# Patient Record
Sex: Male | Born: 1990 | Race: White | Hispanic: No | Marital: Married | Smoking: Former smoker
Health system: Southern US, Community
[De-identification: ages and names within clinical notes are randomized; demographics above are authoritative.]

## PROBLEM LIST (undated history)

## (undated) HISTORY — PX: LASIK: SHX215

## (undated) HISTORY — PX: WISDOM TOOTH EXTRACTION: SHX21

## (undated) HISTORY — PX: EYE SURGERY: SHX253

---

## 2017-12-01 DIAGNOSIS — Z23 Encounter for immunization: Secondary | ICD-10-CM | POA: Diagnosis not present

## 2017-12-01 DIAGNOSIS — Z0000 Encounter for general adult medical examination without abnormal findings: Secondary | ICD-10-CM | POA: Diagnosis not present

## 2020-09-23 ENCOUNTER — Emergency Department (HOSPITAL_COMMUNITY)
Admission: EM | Admit: 2020-09-23 | Discharge: 2020-09-23 | Disposition: A | Discharge status: Home / Self Care | Payer: 59 | Attending: Emergency Medicine | Admitting: Emergency Medicine

## 2020-09-23 ENCOUNTER — Emergency Department (HOSPITAL_COMMUNITY): Payer: 59

## 2020-09-23 ENCOUNTER — Other Ambulatory Visit: Payer: Self-pay

## 2020-09-23 DIAGNOSIS — R1011 Right upper quadrant pain: Secondary | ICD-10-CM | POA: Diagnosis not present

## 2020-09-23 LAB — CBC WITH DIFFERENTIAL/PLATELET
Abs Immature Granulocytes: 0.02 10*3/uL (ref 0.00–0.07)
Basophils Absolute: 0.1 10*3/uL (ref 0.0–0.1)
Basophils Relative: 1 %
Eosinophils Absolute: 0.1 10*3/uL (ref 0.0–0.5)
Eosinophils Relative: 2 %
HCT: 48.9 % (ref 39.0–52.0)
Hemoglobin: 15.8 g/dL (ref 13.0–17.0)
Immature Granulocytes: 0 %
Lymphocytes Relative: 40 %
Lymphs Abs: 3 10*3/uL (ref 0.7–4.0)
MCH: 28.3 pg (ref 26.0–34.0)
MCHC: 32.3 g/dL (ref 30.0–36.0)
MCV: 87.5 fL (ref 80.0–100.0)
Monocytes Absolute: 0.5 10*3/uL (ref 0.1–1.0)
Monocytes Relative: 7 %
Neutro Abs: 3.8 10*3/uL (ref 1.7–7.7)
Neutrophils Relative %: 50 %
Platelets: 261 10*3/uL (ref 150–400)
RBC: 5.59 MIL/uL (ref 4.22–5.81)
RDW: 13.1 % (ref 11.5–15.5)
WBC: 7.5 10*3/uL (ref 4.0–10.5)
nRBC: 0 % (ref 0.0–0.2)

## 2020-09-23 LAB — COMPREHENSIVE METABOLIC PANEL
ALT: 47 U/L — ABNORMAL HIGH (ref 0–44)
AST: 25 U/L (ref 15–41)
Albumin: 4.1 g/dL (ref 3.5–5.0)
Alkaline Phosphatase: 59 U/L (ref 38–126)
Anion gap: 8 (ref 5–15)
BUN: 15 mg/dL (ref 6–20)
CO2: 24 mmol/L (ref 22–32)
Calcium: 9.9 mg/dL (ref 8.9–10.3)
Chloride: 104 mmol/L (ref 98–111)
Creatinine, Ser: 1.04 mg/dL (ref 0.61–1.24)
GFR, Estimated: >60 mL/min (ref >60)
Glucose, Bld: 110 mg/dL — ABNORMAL HIGH (ref 70–99)
Potassium: 4.7 mmol/L (ref 3.5–5.1)
Sodium: 136 mmol/L (ref 135–145)
Total Bilirubin: 0.5 mg/dL (ref 0.3–1.2)
Total Protein: 6.9 g/dL (ref 6.5–8.1)

## 2020-09-23 LAB — LIPASE, BLOOD: Lipase: 22 U/L (ref 11–51)

## 2020-09-23 MED ORDER — HYDROCODONE-ACETAMINOPHEN 5-325 MG PO TABS: 2.0000 | ORAL_TABLET | ORAL | 0 refills | Status: DC | PRN

## 2020-09-23 MED ORDER — MORPHINE SULFATE (PF) 4 MG/ML IV SOLN
4.0000 mg | Freq: Once | INTRAVENOUS | Status: AC
  Administered 2020-09-23: 4 mg via INTRAVENOUS
  Filled 2020-09-23: qty 1

## 2020-09-23 MED ORDER — SODIUM CHLORIDE 0.9 % IV BOLUS
1000.0000 mL | Freq: Once | INTRAVENOUS | Status: AC
  Administered 2020-09-23: 1000 mL via INTRAVENOUS

## 2020-09-23 MED ORDER — ONDANSETRON HCL 4 MG/2ML IJ SOLN
4.0000 mg | Freq: Once | INTRAMUSCULAR | Status: AC
  Administered 2020-09-23: 4 mg via INTRAVENOUS
  Filled 2020-09-23: qty 2

## 2020-09-23 MED ORDER — ONDANSETRON 4 MG PO TBDP: ORAL_TABLET | ORAL | 0 refills | Status: DC

## 2020-09-23 MED ORDER — HYDROCODONE-ACETAMINOPHEN 5-325 MG PO TABS: 1.0000 | ORAL_TABLET | Freq: Four times a day (QID) | ORAL | 0 refills | Status: DC | PRN

## 2020-09-23 NOTE — ED Notes (Signed)
Got patient into a gown on the monitor patient is resting with call bell in reach 

## 2020-09-23 NOTE — Discharge Instructions (Signed)
Your evaluation today was reassuring, labs look good and ultrasound does not show gallstones.  Your symptoms certainly sound like biliary colic.  Could be related to gallbladder dysfunction.  If you continue to have episodes like this to you should follow-up with GI or general surgery and would likely need to have a HIDA scan done.  If you develop worsening pain, fevers, persistent vomiting or any other new or concerning symptoms, return for reevaluation.

## 2020-09-23 NOTE — ED Notes (Signed)
Pt transported to Ultrasound.  

## 2020-09-23 NOTE — ED Provider Notes (Signed)
[facility] EMERGENCY DEPARTMENT Provider Note   CSN: [id number] Arrival date & time: 09/23/20  8295     History Chief Complaint  Patient presents with  . Abdominal Pain    RUQ    John Meyer is a 29 y.o. male.  John Meyer is a 28 y.o. male who is otherwise healthy, presents to the emergency department for evaluation of right upper quadrant abdominal pain.  The symptoms started last night.  Patient states he has a constant dull ache with intermittent sharp pains over the right upper quadrant.  Pain sometimes radiates up to the right shoulder.  He states that he has had similar pains previously about a year ago was having a series of similar pains, saw his PCP after he had had several episodes of it and they have been scheduled him for an outpatient ultrasound a few weeks later which was normal.  He reports that he was trying to avoid fatty or greasy foods in his diet and this seemed to improve until last night.  He has not had any associated fevers or chills.  He does report some nausea but no vomiting.  Normal bowel movements.  No urinary symptoms.  No chest pain or shortness of breath.  He has not seen a surgeon or GI regarding these intermittent right upper quadrant abdominal pains.  He is concerned it could be his gallbladder.  He has not taken any medication to treat the symptoms prior to arrival.  States in the past when he had these pains they would start in the evening but be better by the time he woke up but this morning he reports pain was actually worse which is what prompted his presentation.        No past medical history on file.  There are no problems to display for this patient.        No family history on file.  Social History   Tobacco Use  . Smoking status: Not on file  Substance Use Topics  . Alcohol use: Not on file  . Drug use: Not on file    Home Medications Prior to Admission medications   Medication Sig Start Date End Date  Taking? Authorizing Provider  HYDROcodone-acetaminophen (NORCO) 5-325 MG tablet Take 1 tablet by mouth every 6 (six) hours as needed. 09/23/20   Dartha Lodge, PA-C  ondansetron (ZOFRAN ODT) 4 MG disintegrating tablet 4mg  ODT q4 hours prn nausea/vomit 09/23/20   14/6/21, PA-C    Allergies    Patient has no allergy information on record.  Review of Systems   Review of Systems  Constitutional: Negative for chills and fever.  HENT: Negative.   Respiratory: Negative for cough and shortness of breath.   Cardiovascular: Negative for chest pain.  Gastrointestinal: Positive for abdominal pain and nausea. Negative for diarrhea and vomiting.  Genitourinary: Negative for dysuria and frequency.  Musculoskeletal: Negative for arthralgias and myalgias.  Skin: Negative for color change and rash.  Neurological: Negative for dizziness, syncope and light-headedness.  All other systems reviewed and are negative.   Physical Exam Updated Vital Signs BP 134/84 (BP Location: Left Arm)   Pulse 74   Temp 98.1 F (36.7 C) (Oral)   Resp 17   SpO2 100%   Physical Exam Vitals and nursing note reviewed.  Constitutional:      General: He is not in acute distress.    Appearance: He is well-developed and normal weight. He is not ill-appearing or diaphoretic.  HENT:     Head: Normocephalic and atraumatic.     Mouth/Throat:     Mouth: Mucous membranes are moist.     Pharynx: Oropharynx is clear.  Eyes:     General:        Right eye: No discharge.        Left eye: No discharge.     Pupils: Pupils are equal, round, and reactive to light.  Cardiovascular:     Rate and Rhythm: Normal rate and regular rhythm.     Heart sounds: Normal heart sounds.  Pulmonary:     Effort: Pulmonary effort is normal. No respiratory distress.     Breath sounds: Normal breath sounds. No wheezing or rales.     Comments: Respirations equal and unlabored, patient able to speak in full sentences, lungs clear to  auscultation bilaterally Abdominal:     General: Bowel sounds are normal. There is no distension.     Palpations: Abdomen is soft. There is no mass.     Tenderness: There is abdominal tenderness in the right upper quadrant. There is no guarding. Negative signs include Murphy's sign.     Comments: Abdomen is soft, nondistended, bowel sounds present in all quadrants, there is focal tenderness in the right upper quadrant without guarding or positive Murphy sign, all other quadrants nontender.  Musculoskeletal:        General: No deformity.     Cervical back: Neck supple.  Skin:    General: Skin is warm and dry.     Capillary Refill: Capillary refill takes less than 2 seconds.  Neurological:     Mental Status: He is alert and oriented to person, place, and time.     Coordination: Coordination normal.     Comments: Speech is clear, able to follow commands Moves extremities without ataxia, coordination intact  Psychiatric:        Mood and Affect: Mood normal.        Behavior: Behavior normal.     ED Results / Procedures / Treatments   Labs (all labs ordered are listed, but only abnormal results are displayed) Labs Reviewed  COMPREHENSIVE METABOLIC PANEL - Abnormal; Notable for the following components:      Result Value   Glucose, Bld 110 (*)    ALT 47 (*)    All other components within normal limits  LIPASE, BLOOD  CBC WITH DIFFERENTIAL/PLATELET  URINALYSIS, ROUTINE W REFLEX MICROSCOPIC    EKG None  Radiology US Abdomen Limited RUQ (LIVER/GB)  Result Date: 09/23/2020 CLINICAL DATA:  Right upper quadrant pain EXAM: ULTRASOUND ABDOMEN LIMITED RIGHT UPPER QUADRANT COMPARISON:  None. FINDINGS: Gallbladder: No gallstones or wall thickening visualized. No sonographic Murphy sign noted by sonographer. Common bile duct: Diameter: 3.7 mm Liver: No focal lesion identified. Within normal limits in parenchymal echogenicity. Portal vein is patent on color Doppler imaging with normal  direction of blood flow towards the liver. Other: None. IMPRESSION: Normal right upper quadrant ultrasound. Electronically Signed   By: Marlan Palau M.D.   On: 09/23/2020 14:45    Procedures Procedures (including critical care time)  Medications Ordered in ED Medications  sodium chloride 0.9 % bolus 1,000 mL (0 mLs Intravenous Stopped 09/23/20 1349)  ondansetron (ZOFRAN) injection 4 mg (4 mg Intravenous Given 09/23/20 1249)  morphine 4 MG/ML injection 4 mg (4 mg Intravenous Given 09/23/20 1249)    ED Course  I have reviewed the triage vital signs and the nursing notes.  Pertinent labs & imaging results that  were available during my care of the patient were reviewed by me and considered in my medical decision making (see chart for details).    MDM Rules/Calculators/A&P                         Patient presents to the ED with complaints of abdominal pain. Patient nontoxic appearing, in no apparent distress, vitals WNL. On exam patient tender to palpation in the right upper quadrant but without guarding or focal Murphy sign, no peritoneal signs. Will evaluate with labs and right upper quadrant ultrasound. Analgesics, anti-emetics, and fluids administered.   I have independently ordered, reviewed and interpreted all labs and imaging: CBC: No leukocytosis, normal hemoglobin CMP: Glucose of 110 but no other significant electrolyte derangements, normal renal function, minimal elevation in ALT of 47 but all other LFTs normal Lipase: WNL  Imaging: Right upper quadrant ultrasound overall unremarkable, no evidence of gallstones or cholecystitis, common bile duct of normal diameter  On repeat abdominal exam patient remains without peritoneal signs, doubt cholecystitis, pancreatitis, diverticulitis, appendicitis, bowel obstruction/perforation.  High suspicion for biliary colic given recurrent episodes and these seem to be targeted by certain foods, without gallstones patient may be having some  gallbladder dysfunction, would likely benefit from outpatient HIDA scan.  Will refer to GI and general surgery.  Patient tolerating PO in the emergency department. Will discharge home with supportive measures. I discussed results, treatment plan, need for PCP follow-up, and return precautions with the patient. Will prescribe short course of pain and nausea medication to use in case of severe symptoms.  Provided opportunity for questions, patient confirmed understanding and is in agreement with plan.    Final Clinical Impression(s) / ED Diagnoses Final diagnoses:  RUQ abdominal pain    Rx / DC Orders ED Discharge Orders         Ordered    ondansetron (ZOFRAN ODT) 4 MG disintegrating tablet        09/23/20 1455    HYDROcodone-acetaminophen (NORCO) 5-325 MG tablet  Every 6 hours PRN        09/23/20 1457           Dartha Lodge, [place] 09/23/20 1501    Eber Hong, MD 09/24/20 1259

## 2020-10-21 ENCOUNTER — Other Ambulatory Visit (HOSPITAL_COMMUNITY)
Admission: RE | Admit: 2020-10-21 | Discharge: 2020-10-21 | Disposition: A | Discharge status: Home / Self Care | Payer: 59 | Source: Ambulatory Visit | Attending: Surgery | Admitting: Surgery

## 2020-10-21 DIAGNOSIS — Z20822 Contact with and (suspected) exposure to covid-19: Secondary | ICD-10-CM | POA: Diagnosis not present

## 2020-10-21 DIAGNOSIS — Z01812 Encounter for preprocedural laboratory examination: Secondary | ICD-10-CM | POA: Diagnosis not present

## 2020-10-21 LAB — SARS CORONAVIRUS 2 (TAT 6-24 HRS): SARS Coronavirus 2: NEGATIVE

## 2020-10-22 ENCOUNTER — Other Ambulatory Visit: Payer: Self-pay

## 2020-10-22 ENCOUNTER — Encounter (HOSPITAL_COMMUNITY): Payer: Self-pay | Admitting: Surgery

## 2020-10-22 ENCOUNTER — Ambulatory Visit: Payer: Self-pay | Admitting: Surgery

## 2020-10-22 NOTE — H&P (Signed)
Shaune Leeks Appointment: 09/24/2020 1:50 PM Location: [facility] Patient #: [id number] DOB: 1991-02-16 Married / Language: [place] / Race: White Male   History of Present Illness Maisie Fus A. Danuel Felicetti MD; 09/24/2020 2:23 PM) Patient words: The patient presents for evaluation of retinal quadrant abdominal pain rated he has a 3. history of intermittent epigastric and right upper quadrant pain after eating. Episodes last minutes to hours but seemed to get better on its own. Initially, the pain did not get better. He sought care in the emergency room where ultrasound performed is normal. His LFTs and complete blood count were also normal. He describes the pain as being in his right upper quadrant, sharp in nature made worse with fatty greasy foods. The episodes were short initially but they have progressed to last longer up to hours. It seems to improve when he cuts the fat out of his diet but not totally resolved. Denies any yellowing of the eyes or itching.    CLINICAL DATA: Right upper quadrant pain  EXAM: ULTRASOUND ABDOMEN LIMITED RIGHT UPPER QUADRANT  COMPARISON: None.  FINDINGS: Gallbladder:  No gallstones or wall thickening visualized. No sonographic Murphy sign noted by sonographer.  Common bile duct:  Diameter: 3.7 mm  Liver:  No focal lesion identified. Within normal limits in parenchymal echogenicity. Portal vein is patent on color Doppler imaging with normal direction of blood flow towards the liver.  Other: None.  IMPRESSION: Normal right upper quadrant ultrasound.   Electronically Signed By: Marlan Palau M.D. On: 09/23/2020 14:45.  The patient is a 30 year old male.   Past Surgical History Laurette Schimke, [place]; 09/24/2020 1:57 PM) Oral Surgery   Diagnostic Studies History Misty Stanley [place], [place]; 09/24/2020 1:57 PM) Colonoscopy  never  Allergies Laurette Schimke, RMA; 09/24/2020 1:58 PM) No Known Drug Allergies  [09/24/2020]: Allergies  Reconciled   Medication History Laurette Schimke, [place]; 09/24/2020 1:58 PM) No Current Medications Medications Reconciled  Social History Laurette Schimke, [place]; 09/24/2020 1:57 PM) Alcohol use  Occasional alcohol use. Caffeine use  Carbonated beverages. No drug use  Tobacco use  Former smoker.  Family History Laurette Schimke, [place]; 09/24/2020 1:57 PM) First Degree Relatives  No pertinent family history   Other Problems Laurette Schimke, [place]; 09/24/2020 1:57 PM) No pertinent past medical history     Review of Systems Laurette Schimke RMA; 09/24/2020 1:58 PM) General Not Present- Appetite Loss, Chills, Fatigue, Fever, Night Sweats, Weight Gain and Weight Loss. Skin Not Present- Change in Wart/Mole, Dryness, Hives, Jaundice, New Lesions, Non-Healing Wounds, Rash and Ulcer. HEENT Not Present- Earache, Hearing Loss, Hoarseness, Nose Bleed, Oral Ulcers, Ringing in the Ears, Seasonal Allergies, Sinus Pain, Sore Throat, Visual Disturbances, Wears glasses/contact lenses and Yellow Eyes. Respiratory Not Present- Bloody sputum, Chronic Cough, Difficulty Breathing, Snoring and Wheezing. Breast Not Present- Breast Mass, Breast Pain, Nipple Discharge and Skin Changes. Cardiovascular Not Present- Chest Pain, Difficulty Breathing Lying Down, Leg Cramps, Palpitations, Rapid Heart Rate, Shortness of Breath and Swelling of Extremities. Gastrointestinal Present- Abdominal Pain. Not Present- Bloating, Bloody Stool, Change in Bowel Habits, Chronic diarrhea, Constipation, Difficulty Swallowing, Excessive gas, Gets full quickly at meals, Hemorrhoids, Indigestion, Nausea, Rectal Pain and Vomiting. Male Genitourinary Not Present- Blood in Urine, Change in Urinary Stream, Frequency, Impotence, Nocturia, Painful Urination, Urgency and Urine Leakage. Musculoskeletal Not Present- Back Pain, Joint Pain, Joint Stiffness, Muscle Pain, Muscle Weakness and Swelling of Extremities. Neurological Not Present- Decreased Memory,  Fainting, Headaches, Numbness, Seizures, Tingling, Tremor, Trouble walking and Weakness. Psychiatric Not Present- Anxiety, Bipolar, Change  in Sleep Pattern, Depression, Fearful and Frequent crying. Endocrine Not Present- Cold Intolerance, Excessive Hunger, Hair Changes, Heat Intolerance, Hot flashes and New Diabetes. Hematology Not Present- Blood Thinners, Easy Bruising, Excessive bleeding, Gland problems, HIV and Persistent Infections.  Vitals Lattie Haw [place] RMA; 09/24/2020 1:59 PM) 09/24/2020 1:58 PM Weight: 187.25 lb Height: 70in Body Surface Area: 2.03 m Body Mass Index: 26.87 kg/m  Temp.: 98.34F  Pulse: 88 (Regular)  P.OX: 98% (Room air) BP: 118/78(Sitting, Left Arm, Standard)       Physical Exam (Yoandri Congrove A. Nicolas Sisler MD; 09/24/2020 2:23 PM) General Mental Status-Alert. General Appearance-Consistent with stated age. Hydration-Well hydrated. Voice-Normal.  Head and Neck Head-normocephalic, atraumatic with no lesions or palpable masses.  Eye Eyeball - Bilateral-Extraocular movements intact. Sclera/Conjunctiva - Bilateral-No scleral icterus.  Chest and Lung Exam Chest and lung exam reveals -quiet, even and easy respiratory effort with no use of accessory muscles and on auscultation, normal breath sounds, no adventitious sounds and normal vocal resonance. Inspection Chest Wall - Normal. Back - normal.  Cardiovascular Cardiovascular examination reveals -on palpation PMI is normal in location and amplitude, no palpable S3 or S4. Normal cardiac borders., normal heart sounds, regular rate and rhythm with no murmurs, carotid auscultation reveals no bruits and normal pedal pulses bilaterally.  Abdomen Inspection Inspection of the abdomen reveals - No Hernias. Skin - Scar - no surgical scars. Palpation/Percussion Palpation and Percussion of the abdomen reveal - Soft, Non Tender, No Rebound tenderness, No Rigidity (guarding) and No  hepatosplenomegaly. Auscultation Auscultation of the abdomen reveals - Bowel sounds normal.  Neurologic Neurologic evaluation reveals -alert and oriented x 3 with no impairment of recent or remote memory. Mental Status-Normal.  Musculoskeletal Normal Exam - Left-Upper Extremity Strength Normal and Lower Extremity Strength Normal. Normal Exam - Right-Upper Extremity Strength Normal, Lower Extremity Weakness.    Assessment & Plan (Ovadia Lopp A. Brandee Markin MD; 09/24/2020 2:24 PM) BILIARY DYSKINESIA (K82.8) Impression: Symptoms supported diagnosis of biliary dyskinesia. We discussed the pros and cons of further workup to include a HIDA study to better define his benefit from surgery. He has no interest in pursuing testing and desires cholecystectomy. Discussed the pros and cons of this approach but an informed him that he may have pain after surgery that may require further workup with this approach. I discussed potential complications of surgery have outlined those today with him. After lengthy discussion of the pros and cons of further testing versus observation versus proceeding with cholecystectomy he wished to proceed with laparoscopic cholecystectomy. I quoted a possible 75% benefit to him about doing surgery but a 25% risk that his symptoms would not improved. I discussed other options evaluation to include upper endoscopy depending on his tolerance of that. He opted for laparoscopic cholecystectomy after complete discussion of the above. Current Plans You are being scheduled for surgery- Our schedulers will call you.  You should hear from our office's scheduling department within 5 working days about the location, date, and time of surgery. We try to make accommodations for patient's preferences in scheduling surgery, but sometimes the OR schedule or the surgeon's schedule prevents [place] from making those accommodations.  If you have not heard from our office [phone]) in 5 working  days, call the office and ask for your surgeon's nurse.  If you have other questions about your diagnosis, plan, or surgery, call the office and ask for your surgeon's nurse.  Pt Education - Pamphlet Given - Laparoscopic Gallbladder Surgery: discussed with patient and provided information. The anatomy & physiology  of hepatobiliary & pancreatic function was discussed. The pathophysiology of gallbladder dysfunction was discussed. Natural history risks without surgery was discussed. I feel the risks of no intervention will lead to serious problems that outweigh the operative risks; therefore, I recommended cholecystectomy to remove the pathology. I explained laparoscopic techniques with possible need for an open approach. Probable cholangiogram to evaluate the bilary tract was explained as well.  Risks such as bleeding, infection, abscess, leak, injury to other organs, need for further treatment, heart attack, death, and other risks were discussed. I noted a good likelihood this will help address the problem. Possibility that this will not correct all abdominal symptoms was explained. Goals of post-operative recovery were discussed as well. We will work to minimize complications. An educational handout further explaining the pathology and treatment options was given as well. Questions were answered. The patient expresses understanding & wishes to proceed with surgery.  Pt Education - [facility] Laparosopic Post Op HCI (Gross) Pt Education - [facility] Good Bowel Health (Gross) Pt Education - Laparoscopic Cholecystectomy: gallbladder   Signed electronically by Dortha Schwalbe, MD (09/24/2020 2:25 PM)

## 2020-10-24 ENCOUNTER — Ambulatory Visit (HOSPITAL_COMMUNITY): Payer: 59 | Admitting: Certified Registered Nurse Anesthetist

## 2020-10-24 ENCOUNTER — Ambulatory Visit (HOSPITAL_COMMUNITY)
Admission: RE | Admit: 2020-10-24 | Discharge: 2020-10-24 | Disposition: A | Discharge status: Home / Self Care | Payer: 59 | Attending: Surgery | Admitting: Surgery

## 2020-10-24 ENCOUNTER — Other Ambulatory Visit: Payer: Self-pay

## 2020-10-24 ENCOUNTER — Encounter (HOSPITAL_COMMUNITY)
Admission: RE | Disposition: A | Discharge status: Home / Self Care | Payer: Self-pay | Source: Home / Self Care | Attending: Surgery

## 2020-10-24 ENCOUNTER — Encounter (HOSPITAL_COMMUNITY): Payer: Self-pay | Admitting: Surgery

## 2020-10-24 DIAGNOSIS — K811 Chronic cholecystitis: Secondary | ICD-10-CM | POA: Insufficient documentation

## 2020-10-24 DIAGNOSIS — Z87891 Personal history of nicotine dependence: Secondary | ICD-10-CM | POA: Insufficient documentation

## 2020-10-24 DIAGNOSIS — R1013 Epigastric pain: Secondary | ICD-10-CM | POA: Diagnosis present

## 2020-10-24 HISTORY — PX: CHOLECYSTECTOMY: SHX55

## 2020-10-24 LAB — CBC WITH DIFFERENTIAL/PLATELET
Abs Immature Granulocytes: 0.01 10*3/uL (ref 0.00–0.07)
Basophils Absolute: 0 10*3/uL (ref 0.0–0.1)
Basophils Relative: 1 %
Eosinophils Absolute: 0.1 10*3/uL (ref 0.0–0.5)
Eosinophils Relative: 1 %
HCT: 42.9 % (ref 39.0–52.0)
Hemoglobin: 14.8 g/dL (ref 13.0–17.0)
Immature Granulocytes: 0 %
Lymphocytes Relative: 46 %
Lymphs Abs: 2.7 10*3/uL (ref 0.7–4.0)
MCH: 29.3 pg (ref 26.0–34.0)
MCHC: 34.5 g/dL (ref 30.0–36.0)
MCV: 85 fL (ref 80.0–100.0)
Monocytes Absolute: 0.4 10*3/uL (ref 0.1–1.0)
Monocytes Relative: 7 %
Neutro Abs: 2.6 10*3/uL (ref 1.7–7.7)
Neutrophils Relative %: 45 %
Platelets: 261 10*3/uL (ref 150–400)
RBC: 5.05 MIL/uL (ref 4.22–5.81)
RDW: 12.8 % (ref 11.5–15.5)
WBC: 5.8 10*3/uL (ref 4.0–10.5)
nRBC: 0 % (ref 0.0–0.2)

## 2020-10-24 LAB — COMPREHENSIVE METABOLIC PANEL
ALT: 41 U/L (ref 0–44)
AST: 22 U/L (ref 15–41)
Albumin: 4.3 g/dL (ref 3.5–5.0)
Alkaline Phosphatase: 84 U/L (ref 38–126)
Anion gap: 10 (ref 5–15)
BUN: 15 mg/dL (ref 6–20)
CO2: 23 mmol/L (ref 22–32)
Calcium: 9.3 mg/dL (ref 8.9–10.3)
Chloride: 107 mmol/L (ref 98–111)
Creatinine, Ser: 0.86 mg/dL (ref 0.61–1.24)
GFR, Estimated: >60 mL/min (ref >60)
Glucose, Bld: 91 mg/dL (ref 70–99)
Potassium: 3.9 mmol/L (ref 3.5–5.1)
Sodium: 140 mmol/L (ref 135–145)
Total Bilirubin: 0.8 mg/dL (ref 0.3–1.2)
Total Protein: 7.3 g/dL (ref 6.5–8.1)

## 2020-10-24 SURGERY — LAPAROSCOPIC CHOLECYSTECTOMY
Anesthesia: General

## 2020-10-24 MED ORDER — DEXAMETHASONE SODIUM PHOSPHATE 10 MG/ML IJ SOLN
INTRAMUSCULAR | Status: DC | PRN
  Administered 2020-10-24: 8 mg via INTRAVENOUS

## 2020-10-24 MED ORDER — CHLORHEXIDINE GLUCONATE 0.12 % MT SOLN: 15.0000 mL | Freq: Once | OROMUCOSAL | Status: AC

## 2020-10-24 MED ORDER — MEPERIDINE HCL 25 MG/ML IJ SOLN: 6.2500 mg | INTRAMUSCULAR | Status: DC | PRN

## 2020-10-24 MED ORDER — MIDAZOLAM HCL 2 MG/2ML IJ SOLN
INTRAMUSCULAR | Status: AC
  Filled 2020-10-24: qty 2

## 2020-10-24 MED ORDER — CHLORHEXIDINE GLUCONATE CLOTH 2 % EX PADS: 6.0000 | MEDICATED_PAD | Freq: Once | CUTANEOUS | Status: DC

## 2020-10-24 MED ORDER — ORAL CARE MOUTH RINSE: 15.0000 mL | Freq: Once | OROMUCOSAL | Status: AC

## 2020-10-24 MED ORDER — MIDAZOLAM HCL 2 MG/2ML IJ SOLN
INTRAMUSCULAR | Status: DC | PRN
  Administered 2020-10-24: 2 mg via INTRAVENOUS

## 2020-10-24 MED ORDER — CEFAZOLIN SODIUM-DEXTROSE 2-4 GM/100ML-% IV SOLN
2.0000 g | INTRAVENOUS | Status: AC
  Administered 2020-10-24: 2 g via INTRAVENOUS

## 2020-10-24 MED ORDER — LIDOCAINE 2% (20 MG/ML) 5 ML SYRINGE
INTRAMUSCULAR | Status: AC
  Filled 2020-10-24: qty 5

## 2020-10-24 MED ORDER — LACTATED RINGERS IV SOLN: INTRAVENOUS | Status: DC

## 2020-10-24 MED ORDER — PROPOFOL 10 MG/ML IV BOLUS
INTRAVENOUS | Status: DC | PRN
  Administered 2020-10-24: 170 mg via INTRAVENOUS

## 2020-10-24 MED ORDER — FENTANYL CITRATE (PF) 250 MCG/5ML IJ SOLN
INTRAMUSCULAR | Status: AC
  Filled 2020-10-24: qty 5

## 2020-10-24 MED ORDER — DEXMEDETOMIDINE (PRECEDEX) IN NS 20 MCG/5ML (4 MCG/ML) IV SYRINGE
PREFILLED_SYRINGE | INTRAVENOUS | Status: DC | PRN
  Administered 2020-10-24: 12 ug via INTRAVENOUS
  Administered 2020-10-24: 8 ug via INTRAVENOUS

## 2020-10-24 MED ORDER — OXYCODONE HCL 5 MG PO TABS: 5.0000 mg | ORAL_TABLET | Freq: Four times a day (QID) | ORAL | 0 refills | Status: AC | PRN

## 2020-10-24 MED ORDER — DEXMEDETOMIDINE (PRECEDEX) IN NS 20 MCG/5ML (4 MCG/ML) IV SYRINGE
PREFILLED_SYRINGE | INTRAVENOUS | Status: AC
  Filled 2020-10-24: qty 5

## 2020-10-24 MED ORDER — FENTANYL CITRATE (PF) 250 MCG/5ML IJ SOLN
INTRAMUSCULAR | Status: DC | PRN
  Administered 2020-10-24 (×5): 50 ug via INTRAVENOUS

## 2020-10-24 MED ORDER — ONDANSETRON HCL 4 MG/2ML IJ SOLN
INTRAMUSCULAR | Status: DC | PRN
  Administered 2020-10-24: 4 mg via INTRAVENOUS

## 2020-10-24 MED ORDER — SODIUM CHLORIDE 0.9 % IR SOLN
Status: DC | PRN
  Administered 2020-10-24: 1000 mL

## 2020-10-24 MED ORDER — BUPIVACAINE HCL (PF) 0.25 % IJ SOLN
INTRAMUSCULAR | Status: DC | PRN
  Administered 2020-10-24: 10 mL

## 2020-10-24 MED ORDER — DEXAMETHASONE SODIUM PHOSPHATE 10 MG/ML IJ SOLN
INTRAMUSCULAR | Status: AC
  Filled 2020-10-24: qty 1

## 2020-10-24 MED ORDER — HYDROMORPHONE HCL 1 MG/ML IJ SOLN
0.2500 mg | INTRAMUSCULAR | Status: DC | PRN
  Administered 2020-10-24: 0.25 mg via INTRAVENOUS
  Administered 2020-10-24: 0.5 mg via INTRAVENOUS
  Administered 2020-10-24: 0.25 mg via INTRAVENOUS
  Administered 2020-10-24 (×2): 0.5 mg via INTRAVENOUS

## 2020-10-24 MED ORDER — HYDROMORPHONE HCL 1 MG/ML IJ SOLN
INTRAMUSCULAR | Status: AC
  Filled 2020-10-24: qty 1

## 2020-10-24 MED ORDER — CEFAZOLIN SODIUM-DEXTROSE 2-4 GM/100ML-% IV SOLN
INTRAVENOUS | Status: AC
  Filled 2020-10-24: qty 100

## 2020-10-24 MED ORDER — ROCURONIUM BROMIDE 10 MG/ML (PF) SYRINGE
PREFILLED_SYRINGE | INTRAVENOUS | Status: AC
  Filled 2020-10-24: qty 10

## 2020-10-24 MED ORDER — IBUPROFEN 800 MG PO TABS: 800.0000 mg | ORAL_TABLET | Freq: Three times a day (TID) | ORAL | 0 refills | Status: DC | PRN

## 2020-10-24 MED ORDER — 0.9 % SODIUM CHLORIDE (POUR BTL) OPTIME
TOPICAL | Status: DC | PRN
  Administered 2020-10-24: 1000 mL

## 2020-10-24 MED ORDER — LIDOCAINE 2% (20 MG/ML) 5 ML SYRINGE
INTRAMUSCULAR | Status: DC | PRN
  Administered 2020-10-24: 20 mg via INTRAVENOUS

## 2020-10-24 MED ORDER — ROCURONIUM BROMIDE 10 MG/ML (PF) SYRINGE
PREFILLED_SYRINGE | INTRAVENOUS | Status: DC | PRN
  Administered 2020-10-24: 60 mg via INTRAVENOUS

## 2020-10-24 MED ORDER — SUGAMMADEX SODIUM 200 MG/2ML IV SOLN
INTRAVENOUS | Status: DC | PRN
  Administered 2020-10-24: 200 mg via INTRAVENOUS

## 2020-10-24 MED ORDER — DROPERIDOL 2.5 MG/ML IJ SOLN: 0.6250 mg | Freq: Once | INTRAMUSCULAR | Status: DC | PRN

## 2020-10-24 MED ORDER — PROPOFOL 10 MG/ML IV BOLUS
INTRAVENOUS | Status: AC
  Filled 2020-10-24: qty 20

## 2020-10-24 MED ORDER — ONDANSETRON HCL 4 MG/2ML IJ SOLN
INTRAMUSCULAR | Status: AC
  Filled 2020-10-24: qty 2

## 2020-10-24 MED ORDER — CHLORHEXIDINE GLUCONATE 0.12 % MT SOLN
OROMUCOSAL | Status: AC
  Administered 2020-10-24: 15 mL via OROMUCOSAL
  Filled 2020-10-24: qty 15

## 2020-10-24 MED ORDER — OXYCODONE HCL 5 MG PO TABS: 5.0000 mg | ORAL_TABLET | Freq: Four times a day (QID) | ORAL | 0 refills | Status: DC | PRN

## 2020-10-24 MED ORDER — IBUPROFEN 800 MG PO TABS: 800.0000 mg | ORAL_TABLET | Freq: Three times a day (TID) | ORAL | 0 refills | Status: AC | PRN

## 2020-10-24 SURGICAL SUPPLY — 39 items
APPLIER CLIP ROT 10 11.4 M/L (STAPLE) ×2
BLADE CLIPPER SURG (BLADE) IMPLANT
CANISTER SUCT 3000ML PPV (MISCELLANEOUS) ×2 IMPLANT
CHLORAPREP W/TINT 26 (MISCELLANEOUS) ×2 IMPLANT
CLIP APPLIE ROT 10 11.4 M/L (STAPLE) ×1 IMPLANT
COVER SURGICAL LIGHT HANDLE (MISCELLANEOUS) ×2 IMPLANT
COVER WAND RF STERILE (DRAPES) ×2 IMPLANT
DERMABOND ADVANCED (GAUZE/BANDAGES/DRESSINGS) ×1
DERMABOND ADVANCED .7 DNX12 (GAUZE/BANDAGES/DRESSINGS) ×1 IMPLANT
ELECT REM PT RETURN 9FT ADLT (ELECTROSURGICAL) ×2
ELECTRODE REM PT RTRN 9FT ADLT (ELECTROSURGICAL) ×1 IMPLANT
GLOVE BIO SURGEON STRL SZ8 (GLOVE) ×2 IMPLANT
GLOVE BIOGEL PI IND STRL 8 (GLOVE) ×1 IMPLANT
GLOVE BIOGEL PI INDICATOR 8 (GLOVE) ×1
GOWN STRL REUS W/ TWL LRG LVL3 (GOWN DISPOSABLE) ×2 IMPLANT
GOWN STRL REUS W/ TWL XL LVL3 (GOWN DISPOSABLE) IMPLANT
GOWN STRL REUS W/TWL LRG LVL3 (GOWN DISPOSABLE) ×2
GOWN STRL REUS W/TWL XL LVL3 (GOWN DISPOSABLE)
KIT BASIN OR (CUSTOM PROCEDURE TRAY) ×2 IMPLANT
KIT TURNOVER KIT B (KITS) ×2 IMPLANT
NS IRRIG 1000ML POUR BTL (IV SOLUTION) ×2 IMPLANT
PAD ARMBOARD 7.5X6 YLW CONV (MISCELLANEOUS) IMPLANT
POUCH RETRIEVAL ECOSAC 10 (ENDOMECHANICALS) ×1 IMPLANT
POUCH RETRIEVAL ECOSAC 10MM (ENDOMECHANICALS) ×1
SCISSORS LAP 5X35 DISP (ENDOMECHANICALS) ×2 IMPLANT
SET IRRIG TUBING LAPAROSCOPIC (IRRIGATION / IRRIGATOR) ×2 IMPLANT
SET TUBE SMOKE EVAC HIGH FLOW (TUBING) ×2 IMPLANT
SLEEVE ENDOPATH XCEL 5M (ENDOMECHANICALS) ×2 IMPLANT
SPECIMEN JAR SMALL (MISCELLANEOUS) ×2 IMPLANT
SUT MNCRL AB 4-0 PS2 18 (SUTURE) ×2 IMPLANT
SYR CONTROL 10ML LL (SYRINGE) ×2 IMPLANT
TOWEL GREEN STERILE (TOWEL DISPOSABLE) ×2 IMPLANT
TOWEL GREEN STERILE FF (TOWEL DISPOSABLE) ×2 IMPLANT
TRAY LAPAROSCOPIC MC (CUSTOM PROCEDURE TRAY) ×2 IMPLANT
TROCAR XCEL BLUNT TIP 100MML (ENDOMECHANICALS) ×2 IMPLANT
TROCAR XCEL NON-BLD 11X100MML (ENDOMECHANICALS) ×2 IMPLANT
TROCAR XCEL NON-BLD 5MMX100MML (ENDOMECHANICALS) ×2 IMPLANT
WARMER LAPAROSCOPE (MISCELLANEOUS) ×2 IMPLANT
WATER STERILE IRR 1000ML POUR (IV SOLUTION) ×2 IMPLANT

## 2020-10-24 NOTE — Anesthesia Procedure Notes (Signed)
Procedure Name: Intubation Date/Time: 10/24/2020 3:12 PM Performed by: Modena Morrow, CRNA Pre-anesthesia Checklist: Patient identified, Emergency Drugs available, Suction available and Patient being monitored Patient Re-evaluated:Patient Re-evaluated prior to induction Oxygen Delivery Method: Circle system utilized Preoxygenation: Pre-oxygenation with 100% oxygen Induction Type: IV induction Ventilation: Mask ventilation without difficulty Laryngoscope Size: Miller and 3 Grade View: Grade I Tube type: Oral Tube size: 7.5 mm Number of attempts: 1 Airway Equipment and Method: Stylet and Oral airway Placement Confirmation: ETT inserted through vocal cords under direct vision,  positive ETCO2 and breath sounds checked- equal and bilateral Secured at: 23 cm Tube secured with: Tape Dental Injury: Teeth and Oropharynx as per pre-operative assessment

## 2020-10-24 NOTE — Discharge Instructions (Signed)
[facility] ______[facility] °LAPAROSCOPIC SURGERY: POST OP INSTRUCTIONS °Always review your discharge instruction sheet given to you by the facility where your surgery was performed. °IF YOU HAVE DISABILITY OR FAMILY LEAVE FORMS, YOU MUST BRING THEM TO THE OFFICE FOR PROCESSING.   °DO NOT GIVE THEM TO YOUR DOCTOR. ° °1. A prescription for pain medication may be given to you upon discharge.  Take your pain medication as prescribed, if needed.  If narcotic pain medicine is not needed, then you may take acetaminophen (Tylenol) or ibuprofen (Advil) as needed. °2. Take your usually prescribed medications unless otherwise directed. °3. If you need a refill on your pain medication, please contact your pharmacy.  They will contact our office to request authorization. Prescriptions will not be filled after 5pm or on week-ends. °4. You should follow a light diet the first few days after arrival home, such as soup and crackers, etc.  Be sure to include lots of fluids daily. °5. Most patients will experience some swelling and bruising in the area of the incisions.  Ice packs will help.  Swelling and bruising can take several days to resolve.  °6. It is common to experience some constipation if taking pain medication after surgery.  Increasing fluid intake and taking a stool softener (such as Colace) will usually help or prevent this problem from occurring.  A mild laxative (Milk of Magnesia or Miralax) should be taken according to package instructions if there are no bowel movements after 48 hours. °7. Unless discharge instructions indicate otherwise, you may remove your bandages 24-48 hours after surgery, and you may shower at that time.  You may have steri-strips (small skin tapes) in place directly over the incision.  These strips should be left on the skin for 7-10 days.  If your surgeon used skin glue on the incision, you may shower in 24 hours.  The glue will flake off over the next 2-3 weeks.  Any sutures or  staples will be removed at the office during your follow-up visit. °8. ACTIVITIES:  You may resume regular (light) daily activities beginning the next day--such as daily self-care, walking, climbing stairs--gradually increasing activities as tolerated.  You may have sexual intercourse when it is comfortable.  Refrain from any heavy lifting or straining until approved by your doctor. °a. You may drive when you are no longer taking prescription pain medication, you can comfortably wear a seatbelt, and you can safely maneuver your car and apply brakes. °b. RETURN TO WORK:  __________________________________________________________ °9. You should see your doctor in the office for a follow-up appointment approximately 2-3 weeks after your surgery.  Make sure that you call for this appointment within a day or two after you arrive home to insure a convenient appointment time. °10. OTHER INSTRUCTIONS: __________________________________________________________________________________________________________________________ __________________________________________________________________________________________________________________________ °WHEN TO CALL YOUR DOCTOR: °1. Fever over 101.0 °2. Inability to urinate °3. Continued bleeding from incision. °4. Increased pain, redness, or drainage from the incision. °5. Increasing abdominal pain ° °The clinic staff is available to answer your questions during regular business hours.  Please don’t hesitate to call and ask to speak to one of the nurses for clinical concerns.  If you have a medical emergency, go to the nearest emergency room or call 911.  A surgeon from [facility] is always on call at the hospital. °[address], [address], [place], [place]  [address] ? [address] [address], , [place]   [address] °[phone] ? [phone] ? [phone] [phone] °Web site:   [email or web address] °

## 2020-10-24 NOTE — Transfer of Care (Signed)
Immediate Anesthesia Transfer of Care Note  Patient: John Meyer  Procedure(s) Performed: LAPAROSCOPIC CHOLECYSTECTOMY (N/A )  Patient Location: PACU  Anesthesia Type:General  Level of Consciousness: drowsy and patient cooperative  Airway & Oxygen Therapy: Patient Spontanous Breathing and Patient connected to face mask oxygen  Post-op Assessment: Report given to RN and Post -op Vital signs reviewed and stable  Post vital signs: Reviewed and stable  Last Vitals:  Vitals Value Taken Time  BP 160/110 10/24/20 1617  Temp    Pulse 95 10/24/20 1618  Resp 16 10/24/20 1618  SpO2 97 % 10/24/20 1618  Vitals shown include unvalidated device data.  Last Pain:  Vitals:   10/24/20 1248  TempSrc:   PainSc: 0-No pain      Patients Stated Pain Goal: 2 (10/24/20 1248)  Complications: No complications documented.

## 2020-10-24 NOTE — Op Note (Signed)
Laparoscopic Cholecystectomy  Procedure Note  Indications: This patient presents with symptomatic gallbladder disease and will undergo laparoscopic cholecystectomy. Pt felt to have biliary dyskinesia.   HIDA study not performed after discussing with the patient.  Clinically consistent with biliary dyskinesia and risk / benefits discussed with the patient.The procedure has been discussed with the patient. Operative and non operative treatments have been discussed. Risks of surgery include bleeding, infection,  Common bile duct injury,  Injury to the stomach,liver, colon,small intestine, abdominal wall,  Diaphragm,  Major blood vessels,  And the need for an open procedure.  Other risks include worsening of medical problems, death,  DVT and pulmonary embolism, and cardiovascular events.   Medical options have also been discussed. The patient has been informed of long term expectations of surgery and non surgical options,  The patient agrees to proceed.    Pre-operative Diagnosis: Abdominal pain, epigastric  Post-operative Diagnosis: Same  Surgeon: Dortha Schwalbe  MD   Assistants: Cameron Ali RNFA   Anesthesia: General endotracheal anesthesia and Local anesthesia 0.25.% bupivacaine, with epinephrine  ASA Class: 1, 2  Procedure Details  The patient was seen again in the Holding Room. The risks, benefits, complications, treatment options, and expected outcomes were discussed with the patient. The possibilities of reaction to medication, pulmonary aspiration, perforation of viscus, bleeding, recurrent infection, finding a normal gallbladder, the need for additional procedures, failure to diagnose a condition, the possible need to convert to an open procedure, and creating a complication requiring transfusion or operation were discussed with the patient. The patient and/or family concurred with the proposed plan, giving informed consent. The site of surgery properly noted/marked. The patient was taken to  Operating Room, identified as John Meyer and the procedure verified as Laparoscopic Cholecystectomy with Intraoperative Cholangiograms. A Time Out was held and the above information confirmed.  Prior to the induction of general anesthesia, antibiotic prophylaxis was administered. General endotracheal anesthesia was then administered and tolerated well. After the induction, the abdomen was prepped in the usual sterile fashion. The patient was positioned in the supine position with the left arm comfortably tucked, along with some reverse Trendelenburg.  Local anesthetic agent was injected into the skin near the umbilicus and an incision made. The midline fascia was incised and the Hasson technique was used to introduce a 12 mm port under direct vision. It was secured with a figure of eight Vicryl suture placed in the usual fashion. Pneumoperitoneum was then created with CO2 and tolerated well without any adverse changes in the patient's vital signs. Additional trocars were introduced under direct vision with an 11 mm trocar in the epigastrium and 2 5 mm trocars in the right upper quadrant. All skin incisions were infiltrated with a local anesthetic agent before making the incision and placing the trocars.   The gallbladder was identified, the fundus grasped and retracted cephalad. Adhesions were lysed bluntly and with the electrocautery where indicated, taking care not to injure any adjacent organs or viscus. The infundibulum was grasped and retracted laterally, exposing the peritoneum overlying the triangle of Calot. This was then divided and exposed in a blunt fashion. The cystic duct was clearly identified and bluntly dissected circumferentially. The junctions of the gallbladder, cystic duct and common bile duct were clearly identified prior to the division of any linear structure.   Critical view was obtained.  Cystic duct was small and no evidence of stone disease.  Cholangiogram was not  performed.    The cystic duct was then  ligated  with surgical clips  on the patient side and  clipped on the gallbladder side and divided. The cystic artery was identified, dissected free, ligated with clips and divided as well. Posterior cystic artery clipped and divided.  The gallbladder was dissected from the liver bed in retrograde fashion with the electrocautery. The gallbladder was removed with endoscopic bag retrieval. . The liver bed was irrigated and inspected. Hemostasis was achieved with the electrocautery. Copious irrigation was utilized and was repeatedly aspirated until clear all particulate matter. Hemostasis was achieved with no signs or  bleeding or bile leakage.  Pneumoperitoneum was completely reduced after viewing removal of the trocars under direct vision. The wound was thoroughly irrigated and the fascia was then closed with a figure of eight suture; the skin was then closed with 4 0 MONOCRYL  and a sterile dressing was applied.  Instrument, sponge, and needle counts were correct at closure and at the conclusion of the case.   Findings: Mild chronic cholecystitis   Estimated Blood Loss: less than 50 mL         Drains: none          Total IV Fluids: per anesthesia record          Specimens: Gallbladder           Complications: None; patient tolerated the procedure well.         Disposition: PACU - hemodynamically stable.         Condition: stable

## 2020-10-24 NOTE — Interval H&P Note (Signed)
History and Physical Interval Note:  10/24/2020 2:45 PM  John Meyer  has presented today for surgery, with the diagnosis of BILIARY DYSKINESIA.  The various methods of treatment have been discussed with the patient and family. After consideration of risks, benefits and other options for treatment, the patient has consented to  Procedure(s): LAPAROSCOPIC CHOLECYSTECTOMY (N/A) as a surgical intervention.  The patient's history has been reviewed, patient examined, no change in status, stable for surgery.  I have reviewed the patient's chart and labs.  Questions were answered to the patient's satisfaction.     Kazimierz Springborn A Zandon Talton

## 2020-10-24 NOTE — Anesthesia Preprocedure Evaluation (Addendum)
Anesthesia Evaluation  Patient identified by MRN, date of birth, ID band Patient awake    Reviewed: Allergy & Precautions, NPO status , Patient's Chart, lab work & pertinent test results  Airway Mallampati: I  TM Distance: >3 FB Neck ROM: Full    Dental no notable dental hx.    Pulmonary former smoker,    Pulmonary exam normal breath sounds clear to auscultation       Cardiovascular negative cardio ROS Normal cardiovascular exam Rhythm:Regular Rate:Normal     Neuro/Psych negative neurological ROS     GI/Hepatic negative GI ROS, Neg liver ROS,   Endo/Other  negative endocrine ROS  Renal/GU negative Renal ROS     Musculoskeletal negative musculoskeletal ROS (+)   Abdominal   Peds  Hematology negative hematology ROS (+)   Anesthesia Other Findings   Reproductive/Obstetrics                            Anesthesia Physical Anesthesia Plan  ASA: I  Anesthesia Plan: General   Post-op Pain Management:    Induction: Intravenous  PONV Risk Score and Plan: 3 and Ondansetron, Dexamethasone, Treatment may vary due to age or medical condition and Midazolam  Airway Management Planned: Oral ETT  Additional Equipment: None  Intra-op Plan:   Post-operative Plan: Extubation in OR  Informed Consent: I have reviewed the patients History and Physical, chart, labs and discussed the procedure including the risks, benefits and alternatives for the proposed anesthesia with the patient or authorized representative who has indicated his/her understanding and acceptance.     Dental advisory given  Plan Discussed with: CRNA  Anesthesia Plan Comments:        Anesthesia Quick Evaluation

## 2020-10-24 NOTE — Progress Notes (Signed)
1644 Dr. Armond Hang made aware of elevated bp continue to monitor pre op bp126/95

## 2020-10-25 ENCOUNTER — Encounter (HOSPITAL_COMMUNITY): Payer: Self-pay | Admitting: Surgery

## 2020-10-25 NOTE — Anesthesia Postprocedure Evaluation (Signed)
Anesthesia Post Note  Patient: John Meyer  Procedure(s) Performed: LAPAROSCOPIC CHOLECYSTECTOMY (N/A )     Patient location during evaluation: PACU Anesthesia Type: General Level of consciousness: awake and alert Pain management: pain level controlled Vital Signs Assessment: post-procedure vital signs reviewed and stable Respiratory status: spontaneous breathing, nonlabored ventilation, respiratory function stable and patient connected to nasal cannula oxygen Cardiovascular status: blood pressure returned to baseline and stable Postop Assessment: no apparent nausea or vomiting Anesthetic complications: no   No complications documented.  Last Vitals:  Vitals:   10/24/20 1700 10/24/20 1715  BP: (!) 150/100 (!) 143/94  Pulse: 98 94  Resp: 16 20  Temp:  (!) 36.2 C  SpO2: 97% 94%    Last Pain:  Vitals:   10/24/20 1715  TempSrc:   PainSc: 5    Pain Goal: Patients Stated Pain Goal: 5 (10/24/20 1702)                 Annalis Kaczmarczyk L Laiken Sandy

## 2020-10-28 LAB — SURGICAL PATHOLOGY

## 2021-11-24 IMAGING — US US ABDOMEN LIMITED
1 series · 14 of 25 positions shown · non-contrast
Comparison: None.

CLINICAL DATA: Right upper quadrant pain

EXAM:
ULTRASOUND ABDOMEN LIMITED RIGHT UPPER QUADRANT

[Series 1: us abdomen limited ruq (liver/gb) · 14 of 39 slices shown]
[im 1/39]
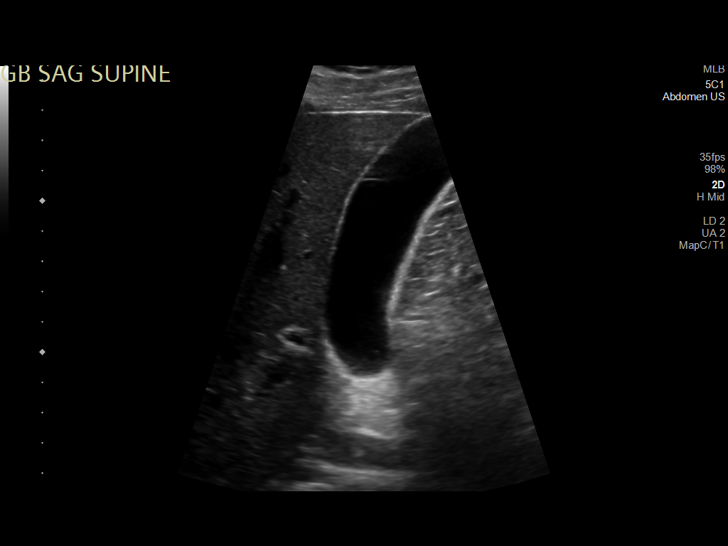
[im 4/39]
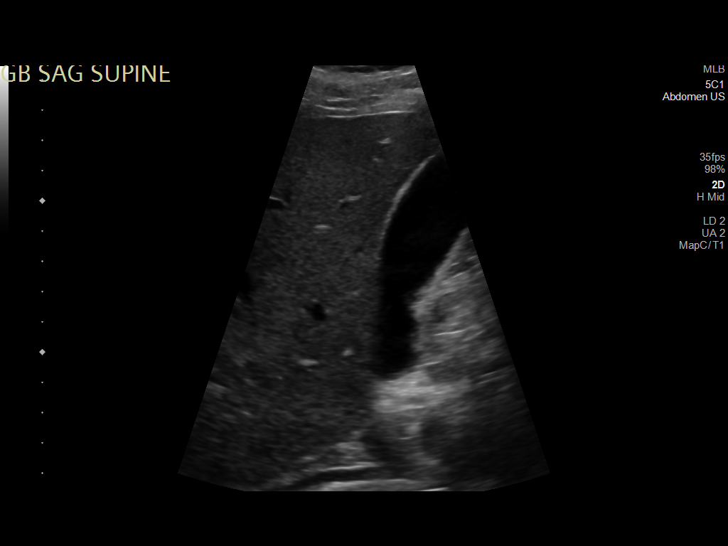
[im 7/39]
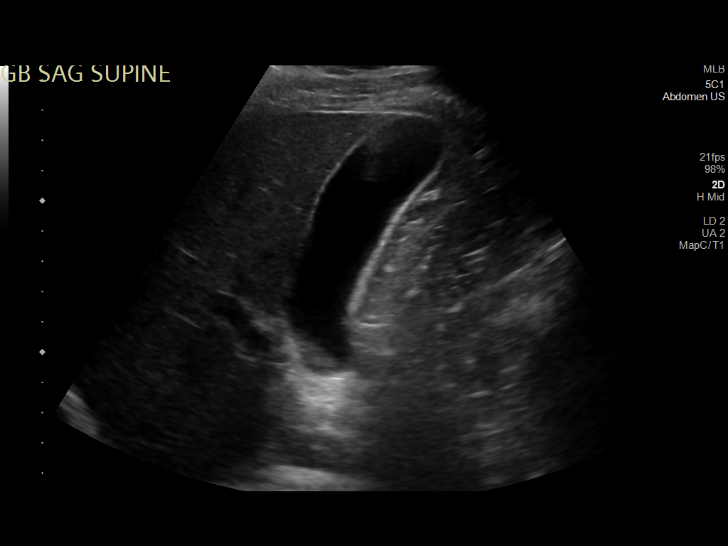
[im 10/39]
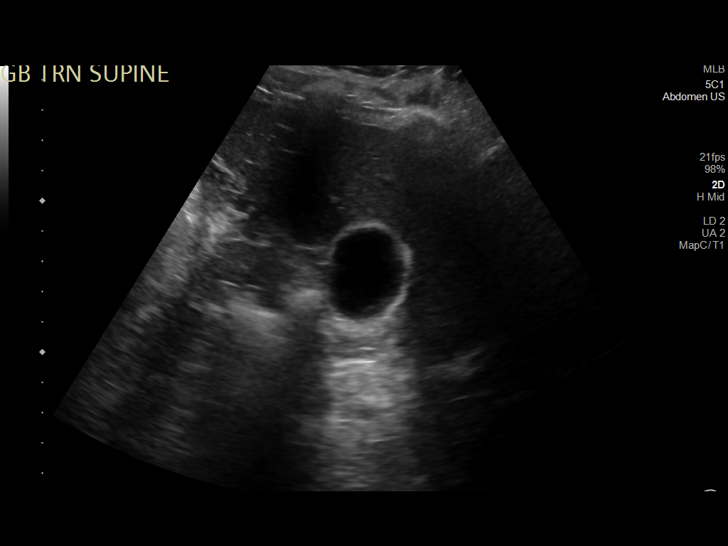
[im 13/39]
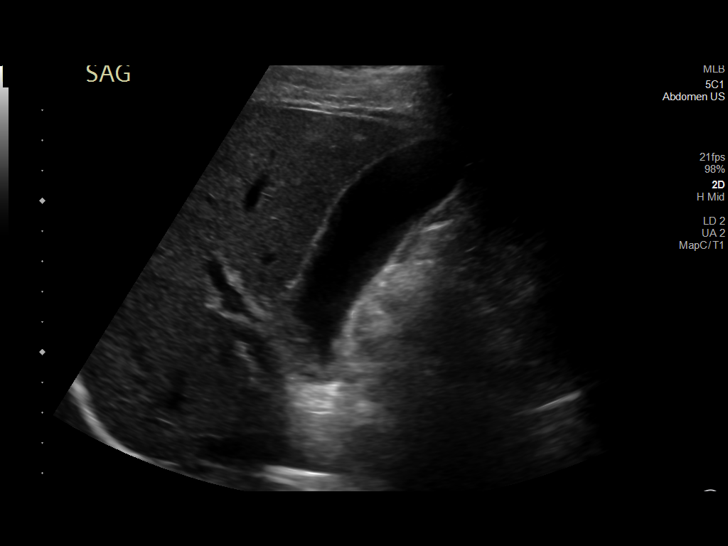
[im 15/39]
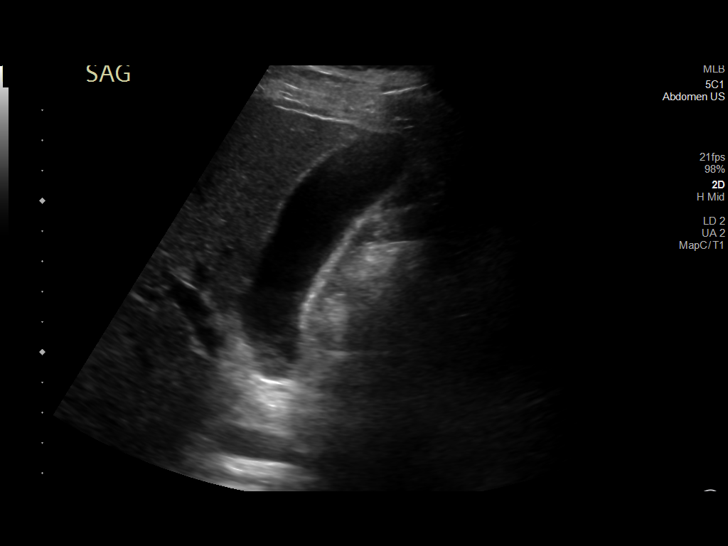
[im 18/39]
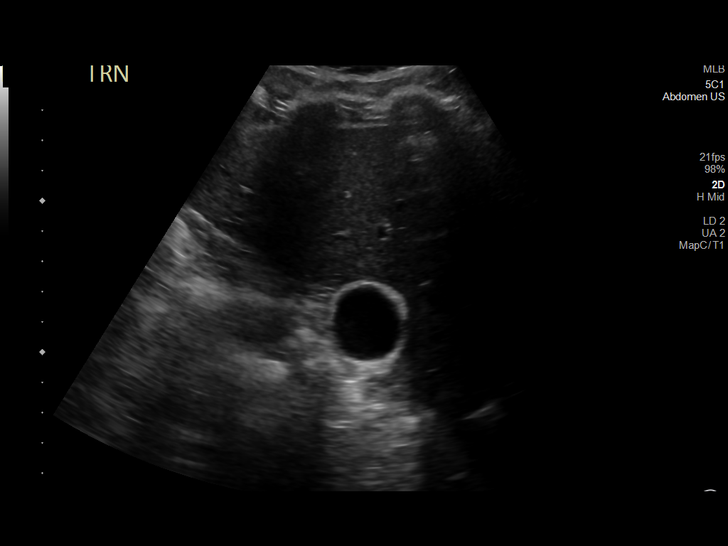
[im 21/39]
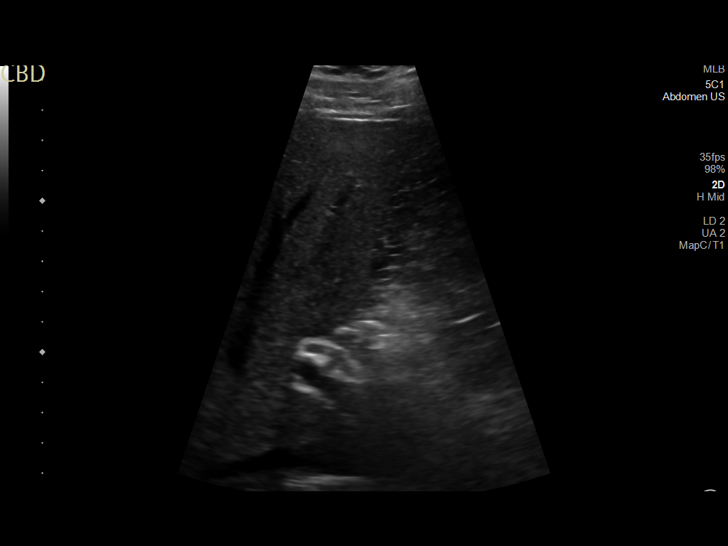
[im 24/39]
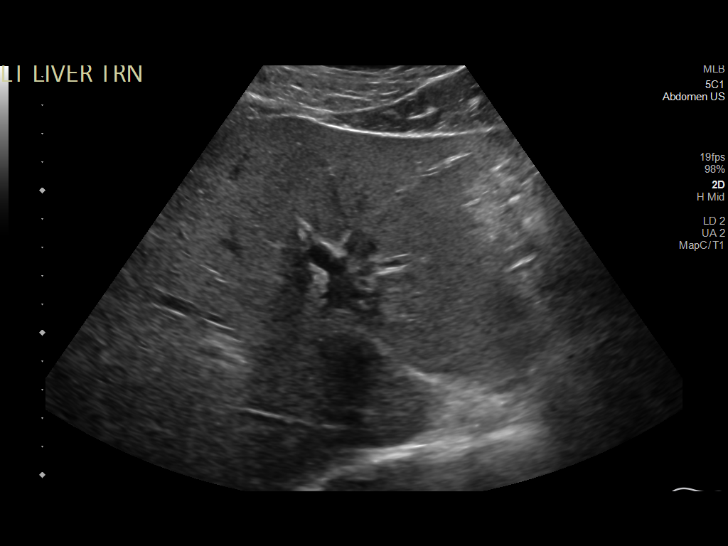
[im 26/39]
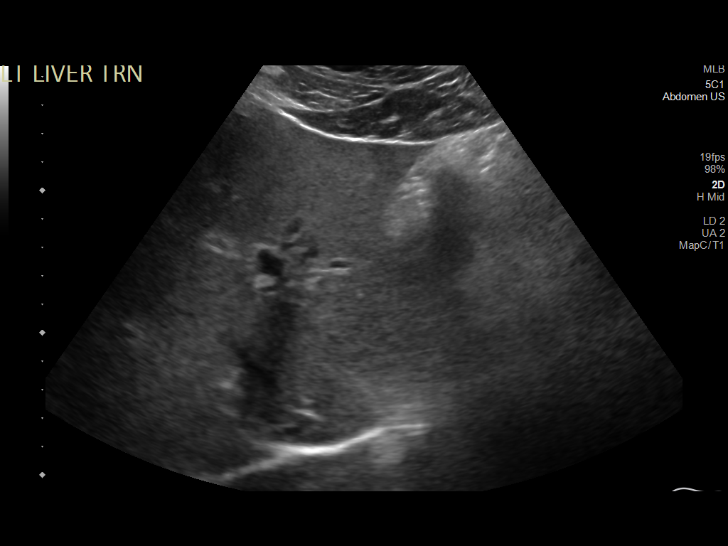
[im 29/39]
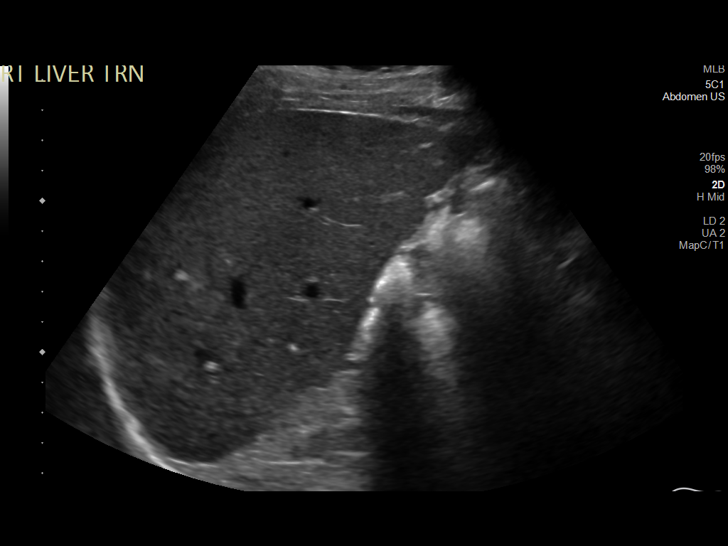
[im 32/39]
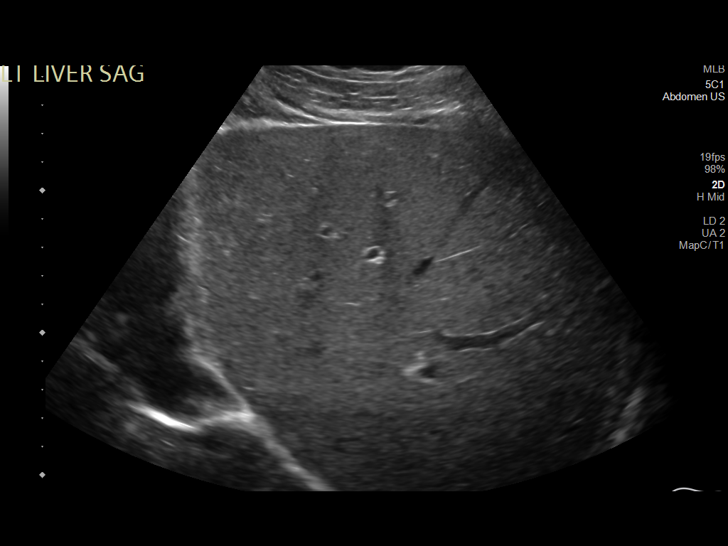
[im 35/39]
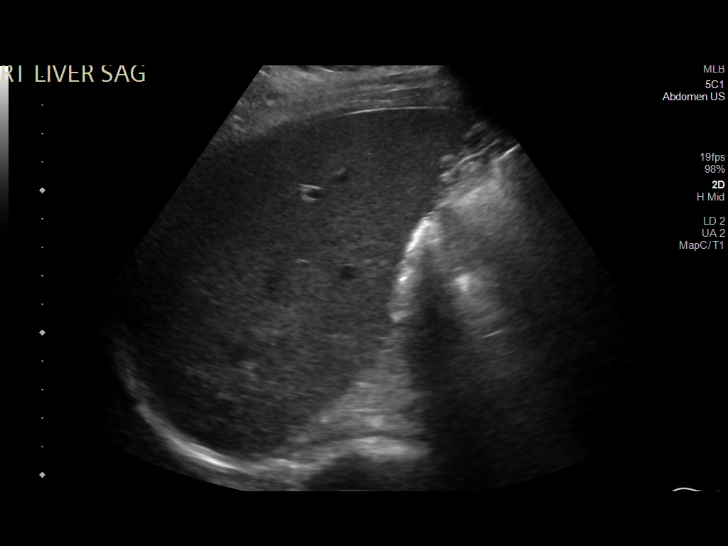
[im 39/39]
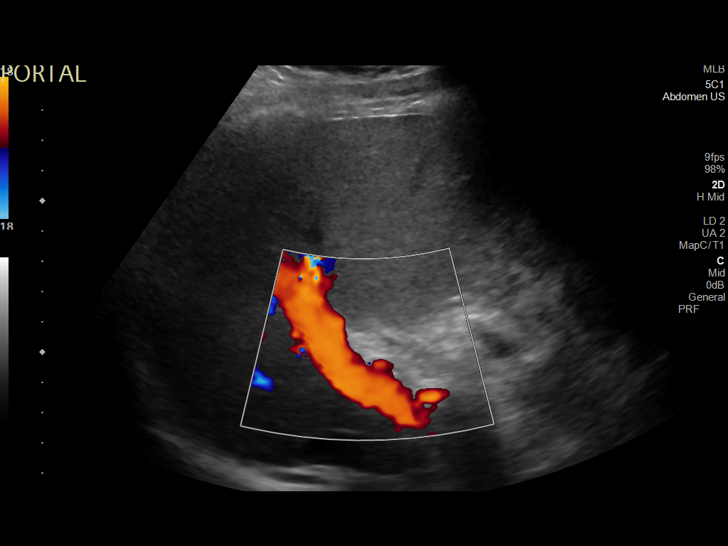

[14 of 25 positions shown; findings below may reference images not displayed]

FINDINGS: Gallbladder:

No gallstones or wall thickening visualized. No sonographic Murphy
sign noted by sonographer.

Common bile duct:

Diameter: 3.7 mm

Liver:

No focal lesion identified. Within normal limits in parenchymal
echogenicity. Portal vein is patent on color Doppler imaging with
normal direction of blood flow towards the liver.

Other: None.
IMPRESSION: Normal right upper quadrant ultrasound.
